# Patient Record
Sex: Male | Born: 1985 | Race: White | Hispanic: Yes | Marital: Single | Smoking: Current some day smoker
Health system: Southern US, Community
[De-identification: ages and names within clinical notes are randomized; demographics above are authoritative.]

---

## 2019-01-05 ENCOUNTER — Emergency Department (HOSPITAL_BASED_OUTPATIENT_CLINIC_OR_DEPARTMENT_OTHER)
Admission: EM | Admit: 2019-01-05 | Discharge: 2019-01-05 | Disposition: A | Discharge status: Home / Self Care | Payer: Self-pay | Attending: Emergency Medicine | Admitting: Emergency Medicine

## 2019-01-05 ENCOUNTER — Other Ambulatory Visit: Payer: Self-pay

## 2019-01-05 ENCOUNTER — Encounter (HOSPITAL_BASED_OUTPATIENT_CLINIC_OR_DEPARTMENT_OTHER): Payer: Self-pay | Admitting: Emergency Medicine

## 2019-01-05 ENCOUNTER — Emergency Department (HOSPITAL_BASED_OUTPATIENT_CLINIC_OR_DEPARTMENT_OTHER): Payer: Self-pay

## 2019-01-05 DIAGNOSIS — Y92322 Soccer field as the place of occurrence of the external cause: Secondary | ICD-10-CM | POA: Insufficient documentation

## 2019-01-05 DIAGNOSIS — F17200 Nicotine dependence, unspecified, uncomplicated: Secondary | ICD-10-CM | POA: Insufficient documentation

## 2019-01-05 DIAGNOSIS — W500XXA Accidental hit or strike by another person, initial encounter: Secondary | ICD-10-CM | POA: Insufficient documentation

## 2019-01-05 DIAGNOSIS — M25571 Pain in right ankle and joints of right foot: Secondary | ICD-10-CM | POA: Insufficient documentation

## 2019-01-05 DIAGNOSIS — Y9366 Activity, soccer: Secondary | ICD-10-CM | POA: Insufficient documentation

## 2019-01-05 DIAGNOSIS — Y999 Unspecified external cause status: Secondary | ICD-10-CM | POA: Insufficient documentation

## 2019-01-05 DIAGNOSIS — S82491A Other fracture of shaft of right fibula, initial encounter for closed fracture: Secondary | ICD-10-CM | POA: Insufficient documentation

## 2019-01-05 MED ORDER — NAPROXEN 500 MG PO TABS: 500.0000 mg | ORAL_TABLET | Freq: Two times a day (BID) | ORAL | 0 refills | Status: AC

## 2019-01-05 MED ORDER — KETOROLAC TROMETHAMINE 30 MG/ML IJ SOLN: 30.0000 mg | Freq: Once | INTRAMUSCULAR | Status: DC

## 2019-01-05 NOTE — ED Notes (Signed)
Patient transported to X-ray 

## 2019-01-05 NOTE — ED Provider Notes (Signed)
[facility] EMERGENCY DEPARTMENT Provider Note   CSN: [id number] Arrival date & time: 01/05/19  1744    History   Chief Complaint Chief Complaint  Patient presents with  . Ankle Pain    HPI Dakota Brown is a 33 y.o. male.     33 y.o male with no PMH is to the ED with a chief complaint of right ankle pain x1 week.  Patient reports he was playing soccer when he proceeded to kick the ball, states a teammate stepped on his foot, he then realized his right foot had everted.  He reports pain along the medial malleolus, states the pain is worse with ambulation along with plantarflexion.  He has been taking Advil for relieving symptoms along with using crutches, states he has noted increasing swelling to his right ankle.  He denies any other injury.  The history is provided by the patient. No language interpreter was used.  Ankle Pain Associated symptoms: no fever     History reviewed. No pertinent past medical history.  There are no active problems to display for this patient.   History reviewed. No pertinent surgical history.      Home Medications    Prior to Admission medications   Not on File    Family History No family history on file.  Social History Social History   Tobacco Use  . Smoking status: Current Some Day Smoker  . Smokeless tobacco: Never Used  Substance Use Topics  . Alcohol use: Yes    Comment: weekly  . Drug use: Never     Allergies   Patient has no allergy information on record.   Review of Systems Review of Systems  Constitutional: Negative for fever.  Musculoskeletal: Positive for arthralgias and joint swelling.     Physical Exam Updated Vital Signs BP 137/86 (BP Location: Right Arm)   Pulse 77   Temp 98.6 F (37 C) (Oral)   Resp 16   Ht 5\' 4"  (1.626 m)   Wt 74.8 kg   SpO2 100%   BMI 28.32 kg/m   Physical Exam Vitals signs and nursing note reviewed.  Constitutional:      Appearance: He is well-developed.   HENT:     Head: Normocephalic and atraumatic.  Eyes:     General: No scleral icterus.    Pupils: Pupils are equal, round, and reactive to light.  Neck:     Musculoskeletal: Normal range of motion.  Cardiovascular:     Heart sounds: Normal heart sounds.  Pulmonary:     Effort: Pulmonary effort is normal.     Breath sounds: Normal breath sounds. No wheezing.  Chest:     Chest wall: No tenderness.  Abdominal:     General: Bowel sounds are normal. There is no distension.     Palpations: Abdomen is soft.     Tenderness: There is no abdominal tenderness.  Musculoskeletal:        General: No tenderness or deformity.     Right ankle: He exhibits swelling. He exhibits normal range of motion, no ecchymosis, no deformity, no laceration and normal pulse.     Comments: Tenderness to palpation along medial malleolus area, full range of motion with pain.  Does endorse tingling around the dorsum of the foot.  Pain is exacerbated with plantarflexion > dorsiflexion.  Skin:    General: Skin is warm and dry.  Neurological:     Mental Status: He is alert and oriented to person, place, and time.  ED Treatments / Results  Labs (all labs ordered are listed, but only abnormal results are displayed) Labs Reviewed - No data to display  EKG None  Radiology Dg Ankle Complete Right  Result Date: 01/05/2019 CLINICAL DATA:  Pain after trauma 1 week ago. EXAM: RIGHT ANKLE - COMPLETE 3+ VIEW COMPARISON:  None. FINDINGS: Mildly displaced oblique distal fibular shaft fracture with approximately 1/3 shaft with of lateral displacement of distal fracture fragment. Widening of the medial ankle mortise. IMPRESSION: Distal fibular shaft fracture with medial tibiotalar articulation widening. Electronically Signed   By: Abigail Miyamoto M.D.   On: 01/05/2019 18:55    Procedures Procedures (including critical care time)  Medications Ordered in ED Medications - No data to display   Initial Impression /  Assessment and Plan / ED Course  I have reviewed the triage vital signs and the nursing notes.  Pertinent labs & imaging results that were available during my care of the patient were reviewed by me and considered in my medical decision making (see chart for details).   Patient with no pertinent past medical history presents to the ED with complaints of right ankle pain, this all began about a week ago after a soccer injury.  Reports he has been icing it, taking Advil, ambulating with crutches without improvement in symptoms. Patient reports he is noted more swelling to his right ankle, he does have pain along the medial malleolus, significant swelling noted to right ankle.  Neurovascularly intact.  Will obtain an x-ray of the right ankle to further evaluate injury.  Xray of his right ankle showed:  Distal fibular shaft fracture with medial tibiotalar articulation  widening.     Injury is 36 week old, will refer patient to orthopedics for appropriate management.  Patient will also go home on a splint posterior along with stair ups for support, he does have crutches which she has been ambulating, encouraged to stay off his foot.  I have communicated with patient in [place], no translator was used during this encounter.  Patient understands and agrees with management.  Return precautions provided at length.    Portions of this note were generated with [employer]. Dictation errors may occur despite best attempts at proofreading.   Final Clinical Impressions(s) / ED Diagnoses   Final diagnoses:  Acute right ankle pain    ED Discharge Orders    None       Janeece Fitting, Hershal Coria 01/05/19 1919    Hayden Rasmussen, MD 01/06/19 1538

## 2019-01-05 NOTE — ED Notes (Signed)
PMS intact before and after. Pt tolerated well. All questions answered. 

## 2019-01-05 NOTE — ED Triage Notes (Signed)
Reports injuring right ankle a week ago playing soccer.  Not seen prior to today.  Brace in place. Using crutches.

## 2019-01-05 NOTE — Discharge Instructions (Signed)
Le he dado una receta para ayudarlo con el dolor e inflamamiento. [place] [employer] al dia.  Se ha puesto una splint en su tobillo derecho, mantenga este limpio y seco. No ponga peso en su pie derecho, camine con sus muletas.   El numero del orthopedista esta en sus papeles, llame por la manana para hacer una cita.

## 2019-01-06 ENCOUNTER — Emergency Department (HOSPITAL_COMMUNITY)
Admission: EM | Admit: 2019-01-06 | Discharge: 2019-01-06 | Disposition: A | Discharge status: Home / Self Care | Payer: Self-pay | Attending: Emergency Medicine | Admitting: Emergency Medicine

## 2019-01-06 ENCOUNTER — Other Ambulatory Visit: Payer: Self-pay

## 2019-01-06 ENCOUNTER — Encounter (HOSPITAL_COMMUNITY): Payer: Self-pay | Admitting: *Deleted

## 2019-01-06 DIAGNOSIS — W51XXXA Accidental striking against or bumped into by another person, initial encounter: Secondary | ICD-10-CM | POA: Insufficient documentation

## 2019-01-06 DIAGNOSIS — Y9366 Activity, soccer: Secondary | ICD-10-CM | POA: Insufficient documentation

## 2019-01-06 DIAGNOSIS — M25571 Pain in right ankle and joints of right foot: Secondary | ICD-10-CM

## 2019-01-06 DIAGNOSIS — F17210 Nicotine dependence, cigarettes, uncomplicated: Secondary | ICD-10-CM | POA: Insufficient documentation

## 2019-01-06 DIAGNOSIS — Y92322 Soccer field as the place of occurrence of the external cause: Secondary | ICD-10-CM | POA: Insufficient documentation

## 2019-01-06 DIAGNOSIS — S82831A Other fracture of upper and lower end of right fibula, initial encounter for closed fracture: Secondary | ICD-10-CM | POA: Insufficient documentation

## 2019-01-06 DIAGNOSIS — Y999 Unspecified external cause status: Secondary | ICD-10-CM | POA: Insufficient documentation

## 2019-01-06 MED ORDER — OXYCODONE-ACETAMINOPHEN 5-325 MG PO TABS
1.0000 | ORAL_TABLET | Freq: Once | ORAL | Status: AC
  Administered 2019-01-06: 1 via ORAL
  Filled 2019-01-06: qty 1

## 2019-01-06 NOTE — ED Notes (Signed)
..  Patient verbalizes understanding of discharge instructions via [employer] interpreter. Opportunity for questioning and answers were provided. Armband removed by staff, pt discharged from ED.

## 2019-01-06 NOTE — ED Triage Notes (Addendum)
Patient presents to ed states he injured his right ankle one week ago, states he was [facility] and today has increased pain, patient has post. Splint and crutches. [employer] interpreter used

## 2019-01-06 NOTE — ED Provider Notes (Addendum)
I have personally seen and examined the patient. I have reviewed the documentation on PMH/FH/Soc Hx. I have discussed the plan of care with the resident and patient.  I have reviewed and agree with the resident's documentation. Please see associated encounter note.    I personally evaluated the patient during the encounter. Briefly, the patient is a 33 y.o. male who presents the ED with right ankle pain.  Went to emergency department yesterday had right ankle fracture.  Splint was placed.  He was unable to get his pain medicine filled overnight because pharmacy was closed.  Patient has good capillary refill.  Splint looks in good condition.  No concern for compartment syndrome.  Will give a dose of pain medicine here.  Has follow-up with orthopedics earlier given to him.  No new trauma.  Given reassurance and discharged in ED in good condition.  This chart was dictated using voice recognition software.  Despite best efforts to proofread,  errors can occur which can change the documentation meaning.     EKG Interpretation None           Lennice Sites, DO 01/06/19 [place], [place], DO 01/06/19 [phone]

## 2019-01-06 NOTE — ED Provider Notes (Signed)
[facility] EMERGENCY DEPARTMENT Provider Note   CSN: [id number] Arrival date & time: 01/06/19  2585    History   Chief Complaint Chief Complaint  Patient presents with  . Foot Pain    HPI Dakota Brown is a 33 y.o. male.     HPI  Patient presents for evaluation of ankle pain.  Reports that 8 days ago he was playing soccer when someone stepped on it and it dislocated.  Friends at the time pulled on it and reduced it.  Has had ongoing severe sharp and achy pain since worse with bearing weight.  He has stayed off of it since.  Went to urgent care yesterday where films showed distal fibular fracture.  Placed in short posterior splint, given prescription for pain medication, and instructed to follow-up.  Reports that he was unable to fill prescription last night because pharmacies were closed and the pain has been worse so came in this morning.  History reviewed. No pertinent past medical history.  There are no active problems to display for this patient.   History reviewed. No pertinent surgical history.      Home Medications    Prior to Admission medications   Medication Sig Start Date End Date Taking? Authorizing Provider  naproxen (NAPROSYN) 500 MG tablet Take 1 tablet (500 mg total) by mouth 2 (two) times daily for 7 days. 01/05/19 01/12/19  Janeece Fitting, PA-C    Family History No family history on file.  Social History Social History   Tobacco Use  . Smoking status: Current Some Day Smoker  . Smokeless tobacco: Never Used  Substance Use Topics  . Alcohol use: Yes    Comment: weekly  . Drug use: Never     Allergies   Patient has no allergy information on record.   Review of Systems Review of Systems  Gastrointestinal: Negative for abdominal pain.  Musculoskeletal: Negative for back pain.       Right ankle pain  Neurological: Negative for weakness and numbness.  Psychiatric/Behavioral: Negative for dysphoric mood.     Physical Exam  Updated Vital Signs BP 121/88 (BP Location: Right Arm)   Pulse 62   Temp 98.2 F (36.8 C) (Oral)   Resp 19   Ht 5\' 4"  (1.626 m)   Wt 68 kg   SpO2 100%   BMI 25.75 kg/m   Physical Exam Vitals signs and nursing note reviewed.  Constitutional:      Appearance: He is well-developed.  HENT:     Head: Normocephalic and atraumatic.  Eyes:     Conjunctiva/sclera: Conjunctivae normal.  Neck:     Musculoskeletal: Neck supple.  Cardiovascular:     Rate and Rhythm: Normal rate and regular rhythm.     Heart sounds: No murmur.  Pulmonary:     Effort: Pulmonary effort is normal. No respiratory distress.     Breath sounds: Normal breath sounds.  Abdominal:     Palpations: Abdomen is soft.     Tenderness: There is no abdominal tenderness.  Musculoskeletal:     Comments: Posterior short splint right lower leg.  Good capillary refill.  Reports normal sensation.  Palpable DP pulse.  Skin:    General: Skin is warm and dry.  Neurological:     Mental Status: He is alert.      ED Treatments / Results  Labs (all labs ordered are listed, but only abnormal results are displayed) Labs Reviewed - No data to display  EKG None  Radiology  Dg Ankle Complete Right  Result Date: 01/05/2019 CLINICAL DATA:  Pain after trauma 1 week ago. EXAM: RIGHT ANKLE - COMPLETE 3+ VIEW COMPARISON:  None. FINDINGS: Mildly displaced oblique distal fibular shaft fracture with approximately 1/3 shaft with of lateral displacement of distal fracture fragment. Widening of the medial ankle mortise. IMPRESSION: Distal fibular shaft fracture with medial tibiotalar articulation widening. Electronically Signed   By: Jeronimo GreavesKyle  Talbot M.D.   On: 01/05/2019 18:55    Procedures Procedures (including critical care time)  Medications Ordered in ED Medications  oxyCODONE-acetaminophen (PERCOCET/ROXICET) 5-325 MG per tablet 1 tablet (has no administration in time range)     Initial Impression / Assessment and Plan / ED Course   I have reviewed the triage vital signs and the nursing notes.  Pertinent labs & imaging results that were available during my care of the patient were reviewed by me and considered in my medical decision making (see chart for details).        Mr. Dakota Brown is a 33 year old male with no chronic medical problems.  I reviewed his medical record.  He was evaluated yesterday in urgent care for the same complaint which he presents with today.  Distal fibular fracture with widening of the mortise.  Suspect ligamentous injury.  He is already in a splint.  No indication to take down as it was placed yesterday and he has reassuring distal exam now.  Provided pain medication in the emergency department and referral for follow-up with orthopedics.  He already has crutches.  Also provided work note.  He had no other questions or concerns.  Patient vocalized understanding and agreement with plan. Hand no other questions or concerns. Was given relevant verbal and written information regarding aftercare and return precautions and discharged in good condition.    Final Clinical Impressions(s) / ED Diagnoses   Final diagnoses:  Acute right ankle pain  Closed fracture of distal end of right fibula, unspecified fracture morphology, initial encounter    ED Discharge Orders    None       Jaclynn MajorStarnes, Drystan Reader, MD 01/06/19 [id number]    Virgina Norfolkuratolo, Adam, DO 01/06/19 [id number]

## 2020-10-13 IMAGING — CR RIGHT ANKLE - COMPLETE 3+ VIEW
3 series · 3 of 3 positions shown · non-contrast
Comparison: None.

CLINICAL DATA: Pain after trauma 1 week ago.

EXAM:
RIGHT ANKLE - COMPLETE 3+ VIEW

[t ankle joint ap right]
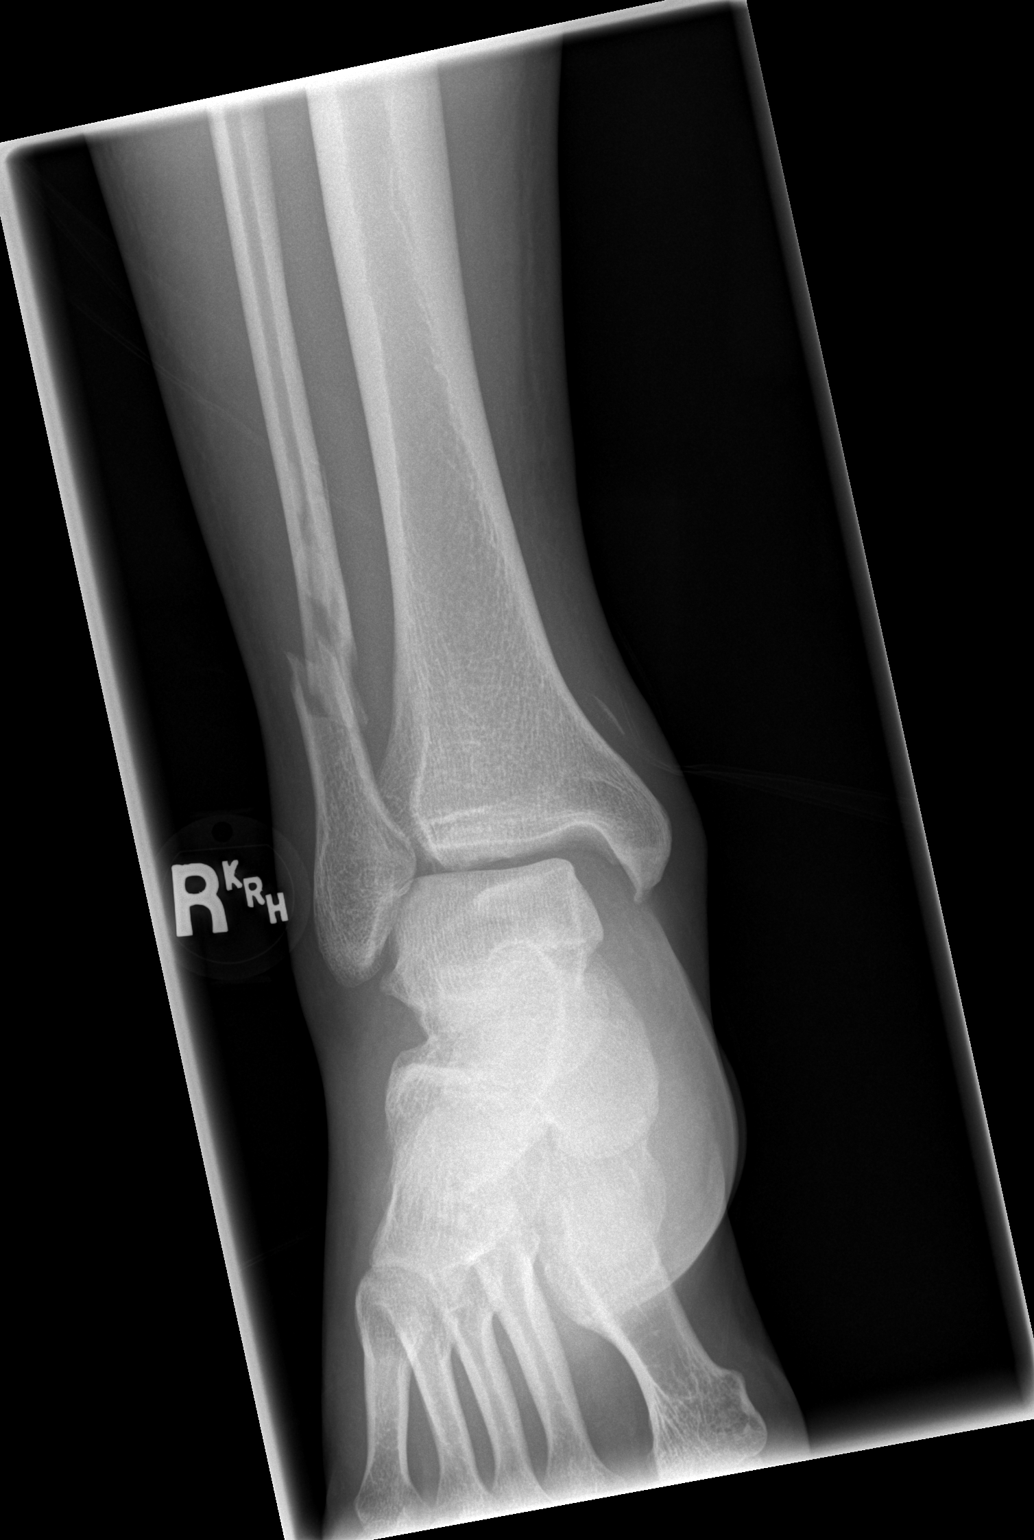

[t ankle joint oblique right]
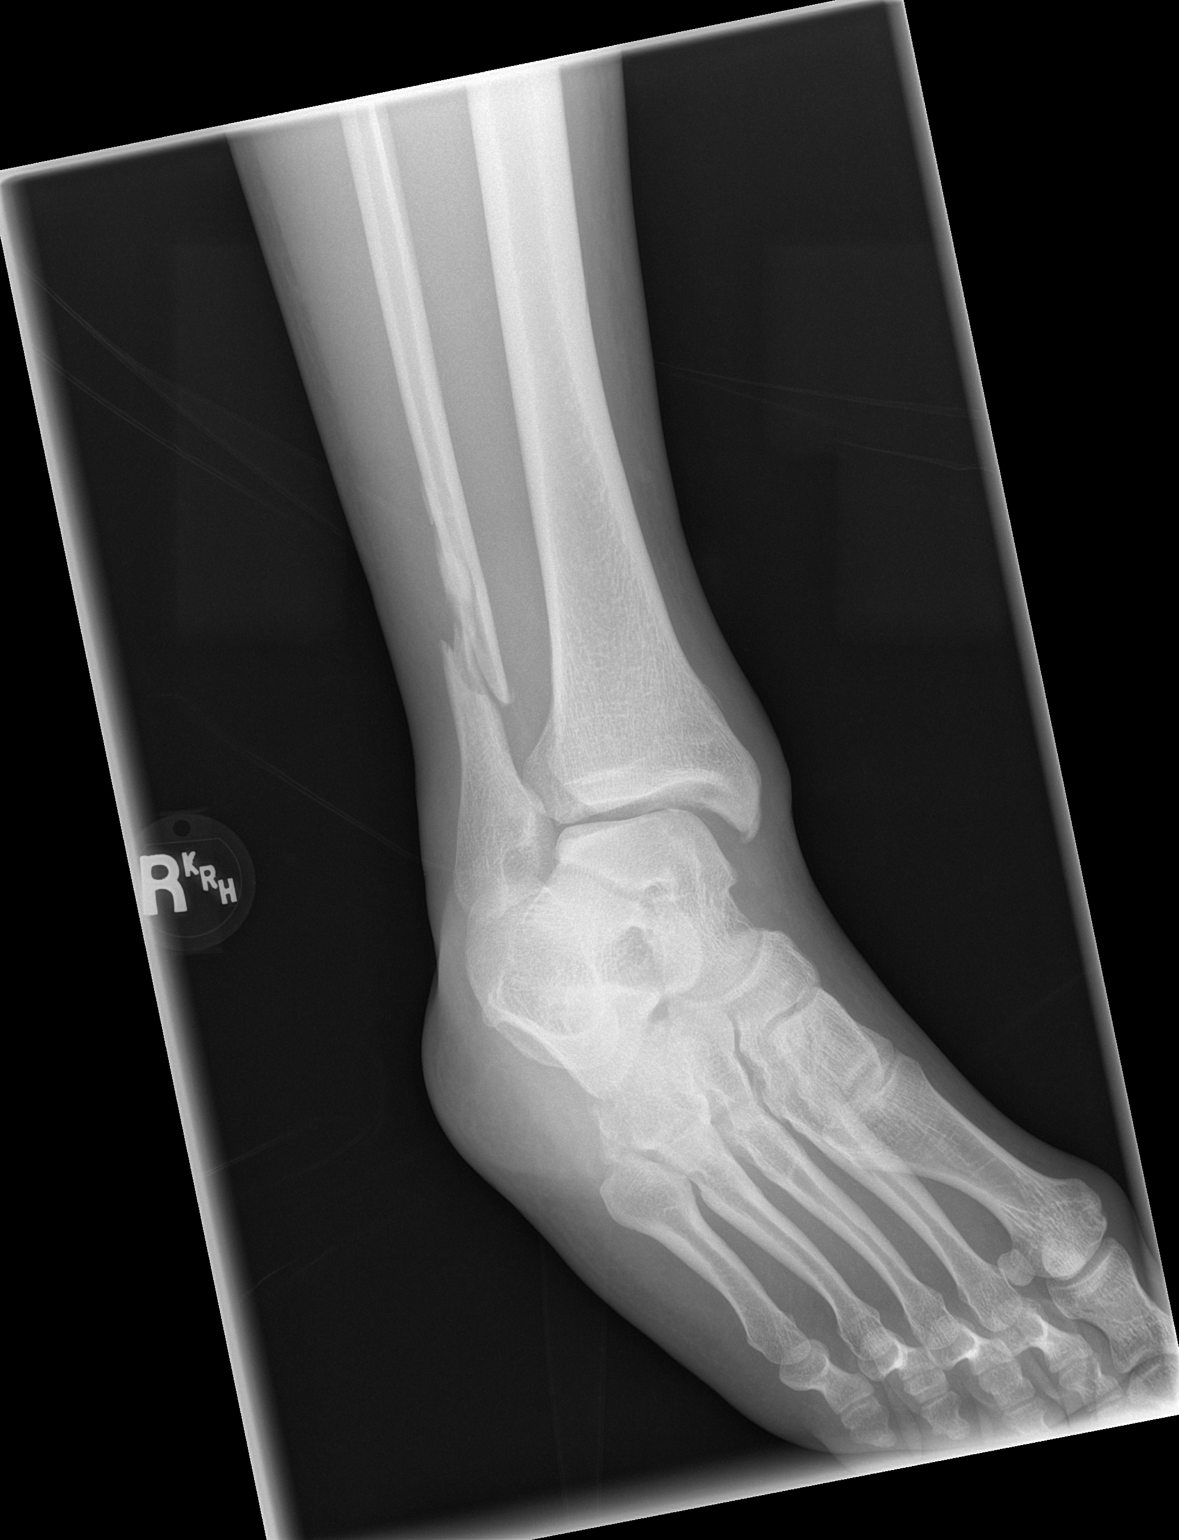

[t ankle joint lat right]
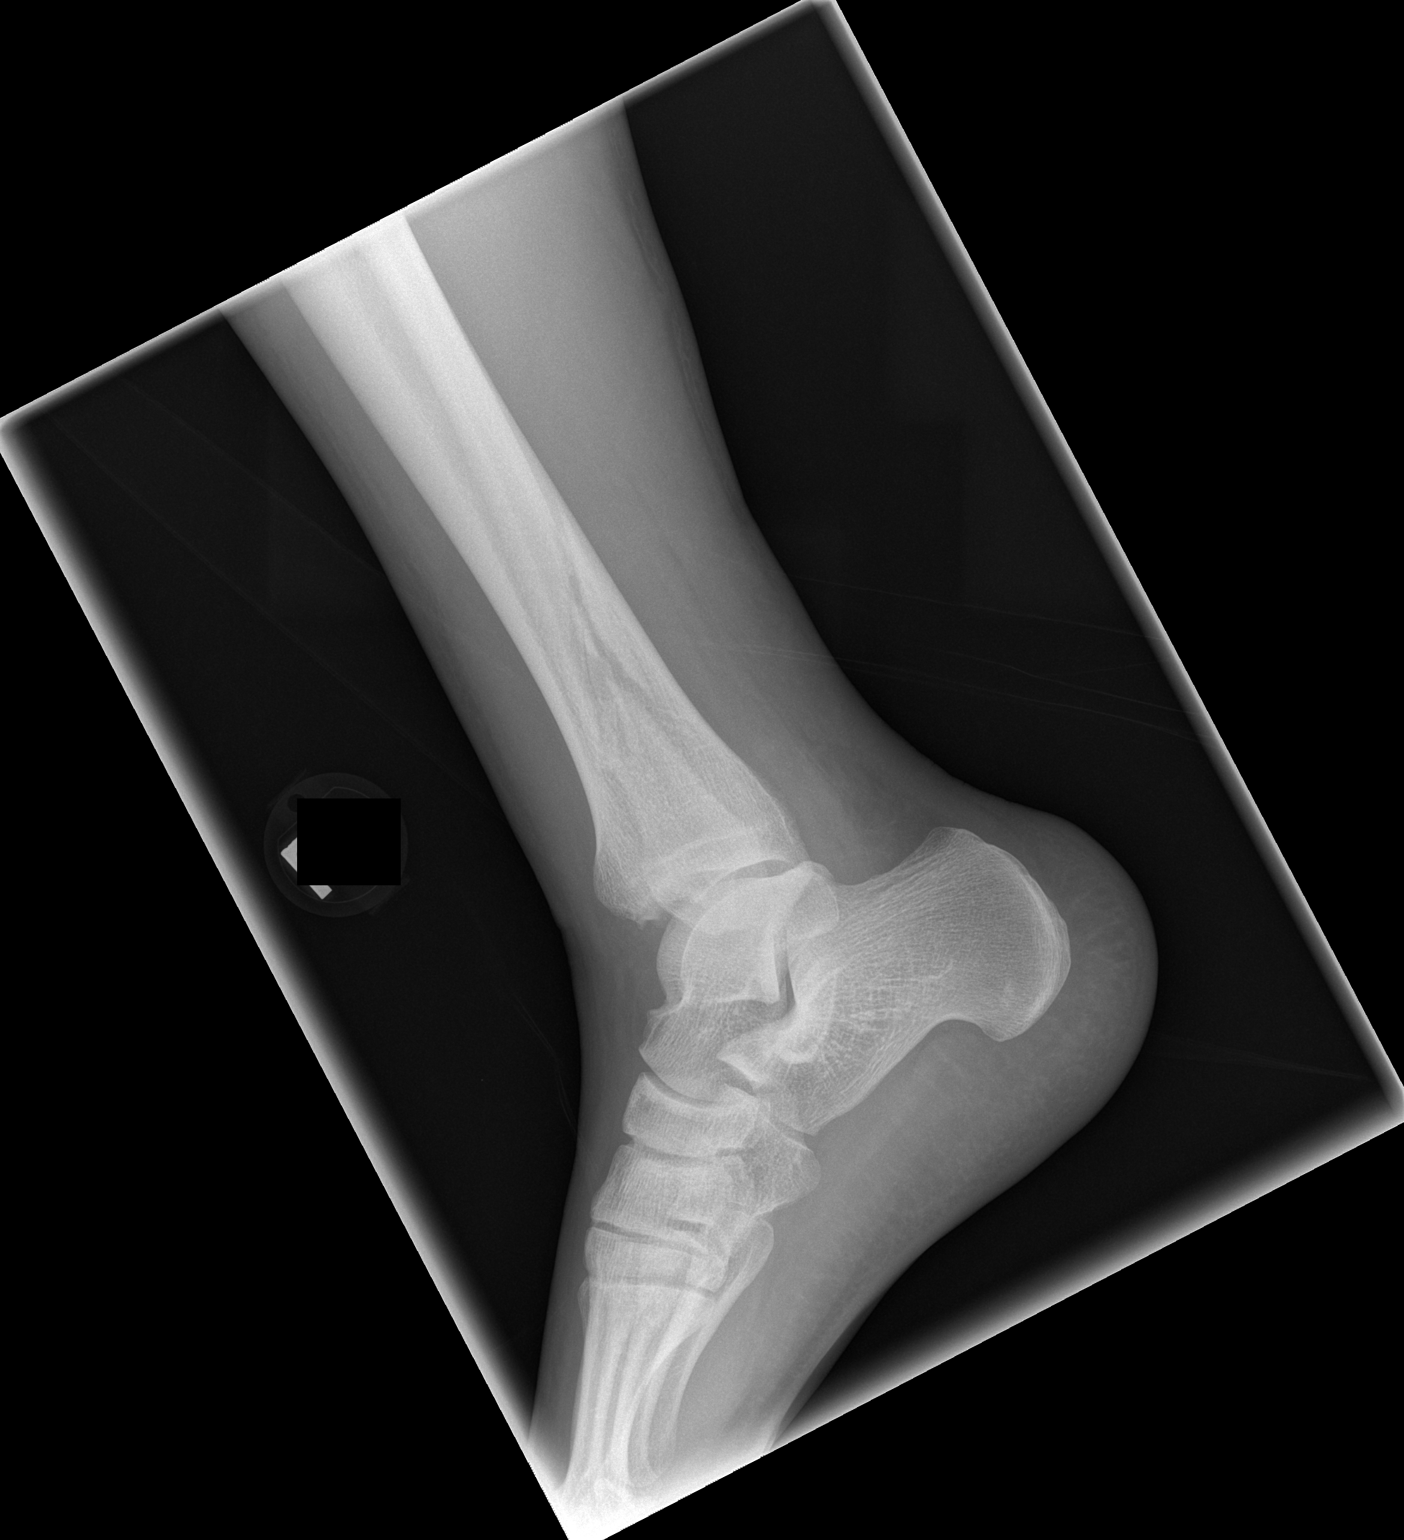

[3 of 3 positions shown; findings below may reference images not displayed]

FINDINGS: Mildly displaced oblique distal fibular shaft fracture with
approximately [DATE] shaft with of lateral displacement of distal
fracture fragment. Widening of the medial ankle mortise.
IMPRESSION: Distal fibular shaft fracture with medial tibiotalar articulation
widening.
